# Patient Record
Sex: Male | Born: 1980 | Hispanic: No | Marital: Married | Smoking: Current every day smoker
Health system: Southern US, Community
[De-identification: ages and names within clinical notes are randomized; demographics above are authoritative.]

## PROBLEM LIST (undated history)

## (undated) DIAGNOSIS — Z789 Other specified health status: Secondary | ICD-10-CM

## (undated) HISTORY — PX: APPENDECTOMY: SHX54

---
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
## 2015-03-13 ENCOUNTER — Encounter (HOSPITAL_COMMUNITY): Payer: Self-pay | Admitting: *Deleted

## 2015-03-13 ENCOUNTER — Emergency Department (HOSPITAL_COMMUNITY)
Admission: EM | Admit: 2015-03-13 | Discharge: 2015-03-13 | Disposition: A | Discharge status: Home / Self Care | Payer: Self-pay | Attending: Emergency Medicine | Admitting: Emergency Medicine

## 2015-03-13 ENCOUNTER — Emergency Department (HOSPITAL_COMMUNITY): Payer: Self-pay

## 2015-03-13 DIAGNOSIS — F17210 Nicotine dependence, cigarettes, uncomplicated: Secondary | ICD-10-CM | POA: Insufficient documentation

## 2015-03-13 DIAGNOSIS — M5417 Radiculopathy, lumbosacral region: Secondary | ICD-10-CM | POA: Insufficient documentation

## 2015-03-13 DIAGNOSIS — R1030 Lower abdominal pain, unspecified: Secondary | ICD-10-CM | POA: Insufficient documentation

## 2015-03-13 LAB — URINALYSIS, ROUTINE W REFLEX MICROSCOPIC
Bilirubin Urine: NEGATIVE
Glucose, UA: NEGATIVE mg/dL
Hgb urine dipstick: NEGATIVE
KETONES UR: NEGATIVE mg/dL
LEUKOCYTES UA: NEGATIVE
Nitrite: NEGATIVE
PROTEIN: NEGATIVE mg/dL
Specific Gravity, Urine: 1.009 (ref 1.005–1.030)
pH: 6 (ref 5.0–8.0)

## 2015-03-13 MED ORDER — NAPROXEN 500 MG PO TABS: 500.0000 mg | ORAL_TABLET | Freq: Two times a day (BID) | ORAL | Status: DC

## 2015-03-13 MED ORDER — PREDNISONE 20 MG PO TABS: 40.0000 mg | ORAL_TABLET | Freq: Every day | ORAL | Status: DC

## 2015-03-13 MED ORDER — OXYCODONE-ACETAMINOPHEN 5-325 MG PO TABS
1.0000 | ORAL_TABLET | Freq: Once | ORAL | Status: AC
  Administered 2015-03-13: 1 via ORAL
  Filled 2015-03-13: qty 1

## 2015-03-13 MED ORDER — HYDROCODONE-ACETAMINOPHEN 5-325 MG PO TABS: 1.0000 | ORAL_TABLET | Freq: Four times a day (QID) | ORAL | Status: DC | PRN

## 2015-03-13 MED ORDER — METHOCARBAMOL 500 MG PO TABS: 500.0000 mg | ORAL_TABLET | Freq: Two times a day (BID) | ORAL | Status: DC

## 2015-03-13 MED ORDER — KETOROLAC TROMETHAMINE 60 MG/2ML IM SOLN
60.0000 mg | Freq: Once | INTRAMUSCULAR | Status: AC
  Administered 2015-03-13: 60 mg via INTRAMUSCULAR
  Filled 2015-03-13: qty 2

## 2015-03-13 NOTE — ED Provider Notes (Signed)
CSN: [id number]     Arrival date & time 03/13/15  1250 History  By signing my name below, I, Iona BeardChristian Pulliam, attest that this documentation has been prepared under the direction and in the presence of Joeline Freer, PA-C.  Electronically Signed: Iona Beardhristian Pulliam, ED Scribe 03/13/2015 at 3:50 PM.   Chief Complaint  Patient presents with  . Flank Pain    The history is provided by the patient. No language interpreter was used.   HPI Comments: Alba DestineJulio S Hodsdon is a 35 y.o. male who presents to the Emergency Department complaining of gradual onset, constant back pain, onset about two weeks ago. Pt states that he lifted a chair at work and felt his back tightening up about 15 minutes later. He believed the pain would go away but states it has remained the same. Pt reports associated throbbing pain in his right groin area and says the back pain is shooting on his lower, right side. The back pain is worsened with movement and prolonged sitting. Naproxentaken and tramadol taken PTA with minimal relief to symptoms. No other worsening or alleviating factors noted. Pt denies difficulty urinating, bowel incontinence, urinary incontinence, numbness, weakness, or any other pertinent symptoms.   History reviewed. No pertinent past medical history. History reviewed. No pertinent past surgical history. History reviewed. No pertinent family history. Social History  Substance Use Topics  . Smoking status: Current Every Day Smoker    Types: Cigarettes  . Smokeless tobacco: Never Used  . Alcohol Use: Yes     Comment: occ.    Review of Systems  Constitutional: Negative for fever.  Genitourinary: Negative for difficulty urinating.  Musculoskeletal: Positive for back pain.  Neurological: Negative for weakness and numbness.    Allergies  Review of patient's allergies indicates not on file.  Home Medications   Prior to Admission medications   Not on File   BP 133/88 mmHg  Pulse 108  Temp(Src)  97.6 F (36.4 C) (Oral)  Resp 16  Ht 5' 8.5" (1.74 m)  Wt 160 lb 5 oz (72.717 kg)  BMI 24.02 kg/m2  SpO2 100% Physical Exam  Constitutional: He is oriented to person, place, and time. He appears well-developed and well-nourished.  HENT:  Head: Normocephalic.  Eyes: EOM are normal.  Neck: Normal range of motion.  Pulmonary/Chest: Effort normal.  Abdominal: He exhibits no distension.  Genitourinary:  Normal bilateral scrotum with no swelling or tenderness. No lesions to the perineum. No penile discharge. Cremasteric reflex present  Musculoskeletal: Normal range of motion.  Tenderness to palpation over right paraspinal muscles, mild tenderness to the midline lumbar spine. No tenderness over right SI joint her right buttock. Pain with right straight leg raise.  Neurological: He is alert and oriented to person, place, and time.  5/5 and equal lower extremity strength. 2+ and equal patellar reflexes bilaterally. Pt able to dorsiflex bilateral toes and feet with good strength against resistance. Equal sensation bilaterally over thighs and lower legs.   Psychiatric: He has a normal mood and affect.  Nursing note and vitals reviewed.   ED Course  Procedures (including critical care time) DIAGNOSTIC STUDIES: Oxygen Saturation is 100% on RA, normal by my interpretation.    COORDINATION OF CARE: 1:36 PM-Discussed treatment plan which includes urinalysis and DG lumbar spine complete  with pt at bedside and pt agreed to plan.   Labs Review Labs Reviewed  URINALYSIS, ROUTINE W REFLEX MICROSCOPIC (NOT AT [facility])    Imaging Review Dg Lumbar Spine Complete  03/13/2015  CLINICAL DATA:  Right-sided lumbar pain for 2 weeks. No known injury. EXAM: LUMBAR SPINE - COMPLETE 4+ VIEW COMPARISON:  None. FINDINGS: Transitional anatomy is identified with 6 lumbar type vertebral bodies. An assimilation joint is seen on the right. No inter pedicular widening is identified. No pars defects. Minimal anterior  wedging of L1 is identified with less than 10% loss of anterior height. No other abnormalities. IMPRESSION: Minimal age-indeterminate anterior wedging of L1 of doubtful acute significance. Recommend clinical correlation. No other abnormalities. Electronically Signed   By: Gerome Sam III M.D   On: 03/13/2015 15:42   I have personally reviewed and evaluated these images and lab results as part of my medical decision-making.   EKG Interpretation None      MDM   Final diagnoses:  None   Patient emergency department complaining of lower back pain radiating to the right buttock and right groin. Neurological exam is unremarkable. Patient appears to be in severe pain. I do not think patient has a kidney stone although on differential. Pt's scrotal exam is nrmal.  His pain is worsened with any movement. Question radicular pain. Will get x-rays of the lumbar spine. Toradol and Percocet given for pain.  4:03 PM X-ray result is showing minimal age-indeterminate anterior wedging of L1. Discussed with Dr. Clayborne Dana, will get lumbar CT for further evaluation. Patient feels slightly improved with medications.  Pt signed out at shift change pending CT. If negative or no emergent findings, follow up with pcp or specialist. Home on prednisone for radicular pain, pain medications, muscle relaxant.    Jaynie Crumble, PA-C 03/14/15 1657  Marily Memos, MD 03/17/15 [phone]

## 2015-03-13 NOTE — ED Notes (Signed)
Pt reports RT flank pain started on 03-03-15 . Pt reports pain radiates into RT groin.

## 2015-03-13 NOTE — Discharge Instructions (Signed)
Naprosyn for pain and inflammation. Robaxin for spasms. Norco for severe pain pain. Prednisone until all gone. Follow up with primary care doctor.     Lumbosacral Radiculopathy Lumbosacral radiculopathy is a condition that involves the spinal nerves and nerve roots in the low back and bottom of the spine. The condition develops when these nerves and nerve roots move out of place or become inflamed and cause symptoms. CAUSES This condition may be caused by: 1. Pressure from a disk that bulges out of place (herniated disk). A disk is a plate of cartilage that separates bones in the spine. 2. Disk degeneration. 3. A narrowing of the bones of the lower back (spinal stenosis). 4. A tumor. 5. An infection. 6. An injury that places sudden pressure on the disks that cushion the bones of your lower spine. RISK FACTORS This condition is more likely to develop in: 1. Males aged 30-50 years. 2. Females aged 50-60 years. 3. People who lift improperly. 4. People who are overweight or live a sedentary lifestyle. 5. People who smoke. 6. People who perform repetitive activities that strain the spine. SYMPTOMS Symptoms of this condition include: 1. Pain that goes down from the back into the legs (sciatica). This is the most common symptom. The pain may be worse with sitting, coughing, or sneezing. 2. Pain and numbness in the arms and legs. 3. Muscle weakness. 4. Tingling. 5. Loss of bladder control or bowel control. DIAGNOSIS This condition is diagnosed with a physical exam and medical history. If the pain is lasting, you may have tests, such as: 1. MRI scan. 2. X-ray. 3. CT scan. 4. Myelogram. 5. Nerve conduction study. TREATMENT This condition is often treated with: 1. Hot packs and ice applied to affected areas. 2. Stretches to improve flexibility. 3. Exercises to strengthen back muscles. 4. Physical therapy. 5. Pain medicine. 6. A steroid injection in the spine. In some cases, no  treatment is needed. If the condition is long-lasting (chronic), or if symptoms are severe, treatment may involve surgery or lifestyle changes, such as following a weight loss plan. HOME CARE INSTRUCTIONS Medicines 1. Take medicines only as directed by your health care provider. 2. Do not drive or operate heavy machinery while taking pain medicine. Injury Care 1. Apply a heat pack to the injured area as directed by your health care provider. 2. Apply ice to the affected area: 1. Put ice in a plastic bag. 2. Place a towel between your skin and the bag. 3. Leave the ice on for 20-30 minutes, every 2 hours while you are awake or as needed. Or, leave the ice on for as long as directed by your health care provider. Other Instructions  If you were shown how to do any exercises or stretches, do them as directed by your health care provider.  If your health care provider prescribed a diet or exercise program, follow it as directed.  Keep all follow-up visits as directed by your health care provider. This is important. SEEK MEDICAL CARE IF:  Your pain does not improve over time even when taking pain medicines. SEEK IMMEDIATE MEDICAL CARE IF:  Your develop severe pain.  Your pain suddenly gets worse.  You develop increasing weakness in your legs.  You lose the ability to control your bladder or bowel.  You have difficulty walking or balancing.  You have a fever.   This information is not intended to replace advice given to you by your health care provider. Make sure you discuss any questions  you have with your health care provider.   Document Released: 12/19/2004 Document Revised: 05/05/2014 Document Reviewed: 12/15/2013 Elsevier Interactive Patient Education 2016 Elsevier Inc.  Back Exercises The following exercises strengthen the muscles that help to support the back. They also help to keep the lower back flexible. Doing these exercises can help to prevent back pain or lessen  existing pain. If you have back pain or discomfort, try doing these exercises 2-3 times each day or as told by your health care provider. When the pain goes away, do them once each day, but increase the number of times that you repeat the steps for each exercise (do more repetitions). If you do not have back pain or discomfort, do these exercises once each day or as told by your health care provider. EXERCISES Single Knee to Chest Repeat these steps 3-5 times for each leg: 7. Lie on your back on a firm bed or the floor with your legs extended. 8. Bring one knee to your chest. Your other leg should stay extended and in contact with the floor. 9. Hold your knee in place by grabbing your knee or thigh. 10. Pull on your knee until you feel a gentle stretch in your lower back. 11. Hold the stretch for 10-30 seconds. 12. Slowly release and straighten your leg. Pelvic Tilt Repeat these steps 5-10 times: 7. Lie on your back on a firm bed or the floor with your legs extended. 8. Bend your knees so they are pointing toward the ceiling and your feet are flat on the floor. 9. Tighten your lower abdominal muscles to press your lower back against the floor. This motion will tilt your pelvis so your tailbone points up toward the ceiling instead of pointing to your feet or the floor. 10. With gentle tension and even breathing, hold this position for 5-10 seconds. Cat-Cow Repeat these steps until your lower back becomes more flexible: 6. Get into a hands-and-knees position on a firm surface. Keep your hands under your shoulders, and keep your knees under your hips. You may place padding under your knees for comfort. 7. Let your head hang down, and point your tailbone toward the floor so your lower back becomes rounded like the back of a cat. 8. Hold this position for 5 seconds. 9. Slowly lift your head and point your tailbone up toward the ceiling so your back forms a sagging arch like the back of a  cow. 10. Hold this position for 5 seconds. Press-Ups Repeat these steps 5-10 times: 6. Lie on your abdomen (face-down) on the floor. 7. Place your palms near your head, about shoulder-width apart. 8. While you keep your back as relaxed as possible and keep your hips on the floor, slowly straighten your arms to raise the top half of your body and lift your shoulders. Do not use your back muscles to raise your upper torso. You may adjust the placement of your hands to make yourself more comfortable. 9. Hold this position for 5 seconds while you keep your back relaxed. 10. Slowly return to lying flat on the floor. Bridges Repeat these steps 10 times: 7. Lie on your back on a firm surface. 8. Bend your knees so they are pointing toward the ceiling and your feet are flat on the floor. 9. Tighten your buttocks muscles and lift your buttocks off of the floor until your waist is at almost the same height as your knees. You should feel the muscles working in your buttocks and the  back of your thighs. If you do not feel these muscles, slide your feet 1-2 inches farther away from your buttocks. 10. Hold this position for 3-5 seconds. 11. Slowly lower your hips to the starting position, and allow your buttocks muscles to relax completely. If this exercise is too easy, try doing it with your arms crossed over your chest. Abdominal Crunches Repeat these steps 5-10 times: 3. Lie on your back on a firm bed or the floor with your legs extended. 4. Bend your knees so they are pointing toward the ceiling and your feet are flat on the floor. 5. Cross your arms over your chest. 6. Tip your chin slightly toward your chest without bending your neck. 7. Tighten your abdominal muscles and slowly raise your trunk (torso) high enough to lift your shoulder blades a tiny bit off of the floor. Avoid raising your torso higher than that, because it can put too much stress on your low back and it does not help to strengthen  your abdominal muscles. 8. Slowly return to your starting position. Back Lifts Repeat these steps 5-10 times: 3. Lie on your abdomen (face-down) with your arms at your sides, and rest your forehead on the floor. 4. Tighten the muscles in your legs and your buttocks. 5. Slowly lift your chest off of the floor while you keep your hips pressed to the floor. Keep the back of your head in line with the curve in your back. Your eyes should be looking at the floor. 6. Hold this position for 3-5 seconds. 7. Slowly return to your starting position. SEEK MEDICAL CARE IF:  Your back pain or discomfort gets much worse when you do an exercise.  Your back pain or discomfort does not lessen within 2 hours after you exercise. If you have any of these problems, stop doing these exercises right away. Do not do them again unless your health care provider says that you can. SEEK IMMEDIATE MEDICAL CARE IF:  You develop sudden, severe back pain. If this happens, stop doing the exercises right away. Do not do them again unless your health care provider says that you can.   This information is not intended to replace advice given to you by your health care provider. Make sure you discuss any questions you have with your health care provider.   Document Released: 01/27/2004 Document Revised: 09/09/2014 Document Reviewed: 02/12/2014 Elsevier Interactive Patient Education 2016 [employer]. [employer] Assistance The [employer] Ways 211 is a great source of information about community services available.  Access by dialing 2-1-1 from anywhere in [place], or by website -  PooledIncome.pl.   Other Local Resources (Updated 01/2015)  Financial Assistance   Services    Phone Number and Address  [facility]  Low-cost medical care - 1st and 3rd Saturday of every month  Must not qualify for public or private insurance and must have limited income [phone] [address] [place], [place]    [place] [employer] of Social Services  Child care  Emergency assistance for housing and [employer]  Medicaid [phone] [address] [place], [place] [address]   [facility]  Low-cost medical care for children, communicable diseases, sexually-transmitted diseases, immunizations, maternity care, womens health and family planning [phone] [address] [place], [place] [address]  [facility]   Medication assistance for [facility] residents  Must meet income requirements [phone] [address] [place],  .    [facility]  Child care  Emergency assistance for housing and [employer]  Medicaid [phone] [address] [place], [place] [address]  [facility]   Low-cost medical care,   Monday through Friday, 9 am to 6 pm.   Accepts Medicare/Medicaid, and self-pay [phone] [address] [place], [place] [address]  [facility]  Low-cost medical care - Monday through Friday, 8:30 am - 5:30 pm  Accepts Medicaid and self-pay [phone] [address] [place], [place] [address]   [facility]  Primary medical care, including for those with sickle cell disease  Accepts Medicare, Medicaid, insurance and self-pay [phone] [address] [place], [place]  [facility]   Primary medical care  Accepts Medicare, [place], insurance and self-pay [phone] [address] [place], [place] [address]   [facility] Department of Social Services  Child care  Emergency assistance for housing and [employer]  Medicaid [phone] [address] [place], [place] [address]  [facility] Department of Health and [employer]  Child  care  Emergency assistance for housing and [employer]  Medicaid [phone] [address] [place], [place] [address]   [facility] Medication Assistance Program  Medication assistance for [facility] residents with no insurance only  Must have a primary care doctor [phone] [address] [place], [place]  [facility]   Primary medical care  [place], [place], insurance  [phone] [address] [place], [place]  [facility]   Medication assistance [phone]  [facility]   Primary medical care  Accepts Medicare, [place], insurance and self-pay [phone] [address] [place], [place] [address]  [facility]   Primary medical care  Accepts Medicare, [place], insurance and self-pay [phone] [address] [place], [place] [address]  [facility]  For [place] residents between the ages of 49 and 71 who do not have any form of health insurance, Medicare, [place], or [place] benefits.  Services are provided free of charge to uninsured patients who fall within federal poverty guidelines.    Hours: Tuesdays and Thursdays, 4:15 - 8 pm [phone] [address] [place], [place] [address]  [facility]     Primary medical care  Dental care  Nutritional counseling  Pharmacy  Accepts Medicaid, Medicare, most insurance.  Fees are adjusted based on ability to pay.   [phone] [facility] [address] [place], [place]  [phone] [facility] [address] [place], [place]  [phone] [facility] [place], [place]  [phone] [facility], [address] [place], [place]  [phone] [facility] [address] [place], [place]  [facility]  Womens health and family planning  [phone] [address] [place], [place]  [facility] Department of Social Services  Child care  Emergency assistance for housing and [employer]  Medicaid [phone] [address], [place], [place] [address]   [facility]    Ages 58 and older  Hours: Mondays and Thursdays, 7:00 am - 9:00 am Patients are seen on a first come, first served basis. [phone], ext. [phone] [place], [place]  [place] Division of Social Services  Child care  Emergency assistance for housing and utilities  Food stamps  Medicaid [phone] [address] [place], [place] [id number]  [facility] [employer]  Medication assistance  Rental assistance  Food pantry  Medication assistance  Housing assistance  Emergency food distribution  Utility assistance [phone] [address] [place], [place]  [phone]  [address] [place], [place] [phone] Hours: Tuesdays and Thursdays from 9am - 12 noon by appointment only  [phone] [address] [place], [place] [id number]  [facility] - Lanae [facility]   Accepts private insurance, [place], and [place].  Payment is based on a sliding scale for those without insurance.  Hours: Mondays, Tuesdays and Thursdays, 8:30 am - 5:30 pm.   [phone] [address] [place], [place]  [facility] - [facility] at [facility]    Accepts private insurance, [place], and [place].  Payment is based on a sliding scale for those without insurance. [phone] [address] [place], [place]  [facility] - Pediatrics at [address] Scientist, research (physical sciences)[place]  Accepts private insurance, [employer]icare, and [place].  Payment is based on a sliding scale for those without insurance [phone] [address], [place], [place]  [facility] - [facility] at [facility]  Accepts private insurance, [place], and  [place].  Payment is based on a sliding scale for those without insurance. [phone] [address] [place], [place]  [facility] - [facility] at [facility]  Accepts private insurance, [place], and [place].  Payment is based on a sliding scale for those without insurance. [phone], [phone] [place], [place].    [facility]  Maternity care.  Accepts Medicaid and self-pay. [phone] [address] [place], [place]

## 2015-03-13 NOTE — ED Notes (Signed)
Declined W/C at D/C and was escorted to lobby by RN. 

## 2015-03-13 NOTE — ED Provider Notes (Signed)
[facility]C shift change pending CT results. For full HPI, please see initial provider's note.  In short, patient presenting for right flank pain 10 days after lifting something heavy at work. Radiation into the groin and right hip area. Movement exacerbates the pain. No urinary symptoms or neurological deficits. Urinalysis negative. Lumbar spine x-ray shows minimal age indeterminate anterior wedging of L1. Provider ordered CT of the lumbar spine to fully evaluate this abnormality.  4:45 - CT lumbar spine with no fracture or malalignment. Patient was discharged home with prednisone, pain medicine and muscle relaxer per initial provider's plan. Patient is to follow-up with a PCP or orthopedic specialist if his symptoms do not improve.  Filed Vitals:   03/13/15 1303  BP: 133/88  Pulse: 108  Temp: 97.6 F (36.4 C)  TempSrc: Oral  Resp: 16  Height: 5' 8.5" (1.74 m)  Weight: 72.717 kg  SpO2: 100%   Results for orders placed or performed during the hospital encounter of 03/13/15 (from the past 48 hour(s))  Urinalysis, Routine w reflex microscopic ([facility])     Status: None   Collection Time: 03/13/15  1:04 PM  Result Value Ref Range   Color, Urine YELLOW YELLOW   APPearance CLEAR CLEAR   Specific Gravity, Urine 1.009 1.005 - 1.030   pH 6.0 5.0 - 8.0   Glucose, UA NEGATIVE NEGATIVE mg/dL   Hgb urine dipstick NEGATIVE NEGATIVE   Bilirubin Urine NEGATIVE NEGATIVE   Ketones, ur NEGATIVE NEGATIVE mg/dL   Protein, ur NEGATIVE NEGATIVE mg/dL   Nitrite NEGATIVE NEGATIVE   Leukocytes, UA NEGATIVE NEGATIVE    Comment: MICROSCOPIC NOT DONE ON URINES WITH NEGATIVE PROTEIN, BLOOD, LEUKOCYTES, NITRITE, OR GLUCOSE <1000 mg/dL.   CT Lumbar Spine Wo Contrast (Final result) Result time: 03/13/15 16:46:16   Final result by Rad Results In Interface (03/13/15 16:46:16)   Narrative:   CLINICAL DATA: Lifting object at work, fell backwards. Back pain for 2  weeks now. Questioning wedge deformity at L1 on plain film.  EXAM: CT LUMBAR SPINE WITHOUT CONTRAST  TECHNIQUE: Multidetector CT imaging of the lumbar spine was performed without intravenous contrast administration. Multiplanar CT image reconstructions were also generated.  COMPARISON: None.  FINDINGS: Normal alignment. Disc spaces are maintained. No visible fracture. L1 appears normal by CT.  IMPRESSION: No fracture or malalignment.   Electronically Signed By: Charlett NoseKevin Dover M.D. On: 03/13/2015 16:46          DG Lumbar Spine Complete (Final result) Result time: 03/13/15 15:42:11   Final result by Rad Results In Interface (03/13/15 15:42:11)   Narrative:   CLINICAL DATA: Right-sided lumbar pain for 2 weeks. No known injury.  EXAM: LUMBAR SPINE - COMPLETE 4+ VIEW  COMPARISON: None.  FINDINGS: Transitional anatomy is identified with 6 lumbar type vertebral bodies. An assimilation joint is seen on the right. No inter pedicular widening is identified. No pars defects. Minimal anterior wedging of L1 is identified with less than 10% loss of anterior height. No other abnormalities.  IMPRESSION: Minimal age-indeterminate anterior wedging of L1 of doubtful acute significance. Recommend clinical correlation. No other abnormalities.   Electronically Signed By: Gerome Samavid Williams III M.D On: 03/13/2015 15:42     Alveta HeimlichStevi Chauntay Paszkiewicz, PA-C 03/13/15 1656

## 2015-07-23 ENCOUNTER — Encounter (HOSPITAL_COMMUNITY): Payer: Self-pay | Admitting: *Deleted

## 2015-07-23 ENCOUNTER — Emergency Department (HOSPITAL_COMMUNITY)
Admission: EM | Admit: 2015-07-23 | Discharge: 2015-07-23 | Disposition: A | Discharge status: Home / Self Care | Payer: Self-pay | Attending: Emergency Medicine | Admitting: Emergency Medicine

## 2015-07-23 ENCOUNTER — Emergency Department (HOSPITAL_COMMUNITY): Payer: Self-pay

## 2015-07-23 DIAGNOSIS — X509XXA Other and unspecified overexertion or strenuous movements or postures, initial encounter: Secondary | ICD-10-CM | POA: Insufficient documentation

## 2015-07-23 DIAGNOSIS — S20211A Contusion of right front wall of thorax, initial encounter: Secondary | ICD-10-CM | POA: Insufficient documentation

## 2015-07-23 DIAGNOSIS — Y929 Unspecified place or not applicable: Secondary | ICD-10-CM | POA: Insufficient documentation

## 2015-07-23 DIAGNOSIS — Y9389 Activity, other specified: Secondary | ICD-10-CM | POA: Insufficient documentation

## 2015-07-23 DIAGNOSIS — Y999 Unspecified external cause status: Secondary | ICD-10-CM | POA: Insufficient documentation

## 2015-07-23 DIAGNOSIS — F17210 Nicotine dependence, cigarettes, uncomplicated: Secondary | ICD-10-CM | POA: Insufficient documentation

## 2015-07-23 MED ORDER — NAPROXEN 500 MG PO TABS: 500.0000 mg | ORAL_TABLET | Freq: Two times a day (BID) | ORAL | Status: DC

## 2015-07-23 MED ORDER — NAPROXEN 250 MG PO TABS
500.0000 mg | ORAL_TABLET | Freq: Once | ORAL | Status: AC
  Administered 2015-07-23: 500 mg via ORAL
  Filled 2015-07-23: qty 2

## 2015-07-23 NOTE — ED Notes (Signed)
Patient able to ambulate independently  

## 2015-07-23 NOTE — Discharge Instructions (Signed)
Your x-ray was normal. Please take naproxen as needed for pain. Return to the ER for new or worsening symptoms.
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
## 2015-07-23 NOTE — ED Notes (Signed)
Pt transported to xray 

## 2015-07-23 NOTE — ED Provider Notes (Signed)
CSN: [id number]     Arrival date & time 07/23/15  1447 History  By signing my name below, I, [name], attest that this documentation has been prepared under the direction and in the presence of Noelle Penner, PA-C Electronically Signed: Charline Bills, ED Scribe 07/23/2015 at 3:29 PM.   Chief Complaint  Patient presents with  . Rib Injury   The history is provided by the patient. No language interpreter was used.   HPI Comments: Colton Peters is a 35 y.o. male who presents to the Emergency Department complaining of a right rib injury sustained 2 days ago. Pt states that he was horse playing with a friend who is considerably larger than he is 2 days ago when he felt a sudden pop in his right ribs. He states that he has had constant sharp pain to the area since. Pt reports increased pain with deep breaths and leaning to the right. He has tried Goody's powder and Naproxen with minimal relief.   History reviewed. No pertinent past medical history. History reviewed. No pertinent past surgical history. History reviewed. No pertinent family history. Social History  Substance Use Topics  . Smoking status: Current Every Day Smoker    Types: Cigarettes  . Smokeless tobacco: Never Used  . Alcohol Use: Yes     Comment: occ.    Review of Systems  Musculoskeletal:       + R rib pain  All other systems reviewed and are negative.  Allergies  Review of patient's allergies indicates no known allergies.  Home Medications   Prior to Admission medications   Medication Sig Start Date End Date Taking? Authorizing Provider  HYDROcodone-acetaminophen (NORCO) 5-325 MG tablet Take 1 tablet by mouth every 6 (six) hours as needed for moderate pain. 03/13/15   Tatyana Kirichenko, PA-C  methocarbamol (ROBAXIN) 500 MG tablet Take 1 tablet (500 mg total) by mouth 2 (two) times daily. 03/13/15   Tatyana Kirichenko, PA-C  naproxen (NAPROSYN) 500 MG tablet Take 1 tablet (500 mg total) by mouth 2 (two) times  daily. 03/13/15   Tatyana Kirichenko, PA-C  predniSONE (DELTASONE) 20 MG tablet Take 2 tablets (40 mg total) by mouth daily. 03/13/15   Tatyana Kirichenko, PA-C   BP 122/82 mmHg  Pulse 100  Temp(Src) 98.5 F (36.9 C) (Oral)  Resp 18  SpO2 100% Physical Exam  Constitutional: He is oriented to person, place, and time. He appears well-developed and well-nourished. No distress.  HENT:  Head: Normocephalic and atraumatic.  Eyes: Conjunctivae and EOM are normal.  Neck: Neck supple. No tracheal deviation present.  Cardiovascular: Normal rate.   Pulmonary/Chest: Effort normal and breath sounds normal. No respiratory distress. He has no wheezes. He has no rales.    Point tenderness to the R lower ribs as depicted  No flail chest No crepitus Lung sounds clear throughout Equal expansion bilaterally  Musculoskeletal: Normal range of motion.  Neurological: He is alert and oriented to person, place, and time.  Skin: Skin is warm and dry.  Psychiatric: He has a normal mood and affect. His behavior is normal.  Nursing note and vitals reviewed.  ED Course  Procedures (including critical care time) DIAGNOSTIC STUDIES: Oxygen Saturation is 100% on RA, normal by my interpretation.    COORDINATION OF CARE: 3:13 PM-Discussed treatment plan which includes XR with pt at bedside and pt agreed to plan.   Labs Review Labs Reviewed - No data to display  Imaging Review No results found. I have personally reviewed  and evaluated these images and lab results as part of my medical decision-making.   EKG Interpretation None      MDM   Final diagnoses:  None   Pt presenting with likely chest contusion. willl obtain CXR to r/o rib fracture. Anticipate d/c home with supportive meds. At end of shift x-ray is still pending will sign out to oncoming team.   I personally performed the services described in this documentation, which was scribed in my presence. The recorded information has been reviewed  and is accurate.   Carlene CoriaSerena Y Lynette Topete, PA-C 07/23/15 1806   Loren Raceravid Yelverton, MD 08/07/15 [phone]
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
## 2021-04-02 ENCOUNTER — Other Ambulatory Visit: Payer: Self-pay

## 2021-04-02 ENCOUNTER — Ambulatory Visit
Admission: EM | Admit: 2021-04-02 | Discharge: 2021-04-02 | Disposition: A | Discharge status: Home / Self Care | Payer: Self-pay | Attending: Physician Assistant | Admitting: Physician Assistant

## 2021-04-02 ENCOUNTER — Encounter: Payer: Self-pay | Admitting: Physician Assistant

## 2021-04-02 DIAGNOSIS — Z113 Encounter for screening for infections with a predominantly sexual mode of transmission: Secondary | ICD-10-CM

## 2021-04-02 NOTE — ED Triage Notes (Signed)
5 day h/o penile discharge and itching. Denies urinary sxs. Pt is requesting STD testing. ?
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
## 2021-04-04 LAB — CYTOLOGY, (ORAL, ANAL, URETHRAL) ANCILLARY ONLY
Chlamydia: NEGATIVE
Comment: NEGATIVE
Comment: NEGATIVE
Comment: NORMAL
Neisseria Gonorrhea: NEGATIVE
Trichomonas: NEGATIVE

## 2021-04-05 ENCOUNTER — Telehealth: Payer: Self-pay | Admitting: Nurse Practitioner

## 2021-04-05 DIAGNOSIS — R369 Urethral discharge, unspecified: Secondary | ICD-10-CM

## 2021-04-05 MED ORDER — DOXYCYCLINE HYCLATE 100 MG PO TABS: 100.0000 mg | ORAL_TABLET | Freq: Two times a day (BID) | ORAL | 0 refills | Status: AC
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
## 2021-12-29 ENCOUNTER — Emergency Department (HOSPITAL_COMMUNITY): Payer: 59

## 2021-12-29 ENCOUNTER — Encounter (HOSPITAL_COMMUNITY)
Admission: EM | Disposition: A | Discharge status: Home / Self Care | Payer: Self-pay | Source: Home / Self Care | Attending: Emergency Medicine

## 2021-12-29 ENCOUNTER — Ambulatory Visit (HOSPITAL_COMMUNITY)
Admission: EM | Admit: 2021-12-29 | Discharge: 2021-12-30 | Disposition: A | Discharge status: Home / Self Care | Payer: 59 | Attending: Emergency Medicine | Admitting: Emergency Medicine

## 2021-12-29 DIAGNOSIS — S6431XA Injury of digital nerve of right thumb, initial encounter: Secondary | ICD-10-CM | POA: Diagnosis not present

## 2021-12-29 DIAGNOSIS — S62511B Displaced fracture of proximal phalanx of right thumb, initial encounter for open fracture: Secondary | ICD-10-CM | POA: Insufficient documentation

## 2021-12-29 DIAGNOSIS — Y9301 Activity, walking, marching and hiking: Secondary | ICD-10-CM | POA: Diagnosis not present

## 2021-12-29 DIAGNOSIS — W3400XA Accidental discharge from unspecified firearms or gun, initial encounter: Secondary | ICD-10-CM | POA: Diagnosis not present

## 2021-12-29 DIAGNOSIS — Z23 Encounter for immunization: Secondary | ICD-10-CM | POA: Diagnosis not present

## 2021-12-29 DIAGNOSIS — S62511A Displaced fracture of proximal phalanx of right thumb, initial encounter for closed fracture: Secondary | ICD-10-CM | POA: Diagnosis not present

## 2021-12-29 DIAGNOSIS — S62521A Displaced fracture of distal phalanx of right thumb, initial encounter for closed fracture: Secondary | ICD-10-CM | POA: Diagnosis not present

## 2021-12-29 DIAGNOSIS — W3400XD Accidental discharge from unspecified firearms or gun, subsequent encounter: Secondary | ICD-10-CM

## 2021-12-29 DIAGNOSIS — Y92410 Unspecified street and highway as the place of occurrence of the external cause: Secondary | ICD-10-CM | POA: Diagnosis not present

## 2021-12-29 DIAGNOSIS — S61031A Puncture wound without foreign body of right thumb without damage to nail, initial encounter: Secondary | ICD-10-CM | POA: Diagnosis present

## 2021-12-29 DIAGNOSIS — S65111A Laceration of radial artery at wrist and hand level of right arm, initial encounter: Secondary | ICD-10-CM | POA: Diagnosis not present

## 2021-12-29 HISTORY — DX: Accidental discharge from unspecified firearms or gun, subsequent encounter: W34.00XD

## 2021-12-29 HISTORY — PX: INCISION AND DRAINAGE OF WOUND: SHX1803

## 2021-12-29 HISTORY — PX: TENDON REPAIR: SHX5111

## 2021-12-29 LAB — COMPREHENSIVE METABOLIC PANEL
ALT: 16 U/L (ref 0–44)
AST: 29 U/L (ref 15–41)
Albumin: 3.7 g/dL (ref 3.5–5.0)
Alkaline Phosphatase: 52 U/L (ref 38–126)
Anion gap: 14 (ref 5–15)
BUN: 15 mg/dL (ref 6–20)
CO2: 20 mmol/L — ABNORMAL LOW (ref 22–32)
Calcium: 8.8 mg/dL — ABNORMAL LOW (ref 8.9–10.3)
Chloride: 105 mmol/L (ref 98–111)
Creatinine, Ser: 1.24 mg/dL (ref 0.61–1.24)
GFR, Estimated: >60 mL/min (ref >60)
Glucose, Bld: 136 mg/dL — ABNORMAL HIGH (ref 70–99)
Potassium: 3.5 mmol/L (ref 3.5–5.1)
Sodium: 139 mmol/L (ref 135–145)
Total Bilirubin: 0.7 mg/dL (ref 0.3–1.2)
Total Protein: 6.8 g/dL (ref 6.5–8.1)

## 2021-12-29 LAB — PROTIME-INR
INR: 0.9 (ref 0.8–1.2)
Prothrombin Time: 12.1 seconds (ref 11.4–15.2)

## 2021-12-29 LAB — CBC
HCT: 45.3 % (ref 39.0–52.0)
Hemoglobin: 14 g/dL (ref 13.0–17.0)
MCH: 25.9 pg — ABNORMAL LOW (ref 26.0–34.0)
MCHC: 30.9 g/dL (ref 30.0–36.0)
MCV: 83.9 fL (ref 80.0–100.0)
Platelets: 275 10*3/uL (ref 150–400)
RBC: 5.4 MIL/uL (ref 4.22–5.81)
RDW: 16.1 % — ABNORMAL HIGH (ref 11.5–15.5)
WBC: 9.6 10*3/uL (ref 4.0–10.5)
nRBC: 0 % (ref 0.0–0.2)

## 2021-12-29 LAB — APTT: aPTT: 24 seconds (ref 24–36)

## 2021-12-29 SURGERY — IRRIGATION AND DEBRIDEMENT WOUND
Anesthesia: General | Laterality: Right

## 2021-12-29 MED ORDER — CEFAZOLIN SODIUM-DEXTROSE 1-4 GM/50ML-% IV SOLN
1.0000 g | Freq: Once | INTRAVENOUS | Status: AC
  Administered 2021-12-29: 1 g via INTRAVENOUS
  Filled 2021-12-29: qty 50

## 2021-12-29 MED ORDER — FENTANYL CITRATE (PF) 250 MCG/5ML IJ SOLN
INTRAMUSCULAR | Status: AC
  Filled 2021-12-29: qty 5

## 2021-12-29 MED ORDER — TETANUS-DIPHTH-ACELL PERTUSSIS 5-2.5-18.5 LF-MCG/0.5 IM SUSY
0.5000 mL | PREFILLED_SYRINGE | Freq: Once | INTRAMUSCULAR | Status: AC
  Administered 2021-12-29: 0.5 mL via INTRAMUSCULAR
  Filled 2021-12-29: qty 0.5

## 2021-12-29 MED ORDER — MIDAZOLAM HCL 2 MG/2ML IJ SOLN
INTRAMUSCULAR | Status: AC
  Filled 2021-12-29: qty 2

## 2021-12-29 MED ORDER — MORPHINE SULFATE (PF) 4 MG/ML IV SOLN
4.0000 mg | Freq: Once | INTRAVENOUS | Status: AC
  Administered 2021-12-29: 4 mg via INTRAVENOUS
  Filled 2021-12-29: qty 1

## 2021-12-29 SURGICAL SUPPLY — 50 items
BAG COUNTER SPONGE SURGICOUNT (BAG) ×1 IMPLANT
BAND RUBBER #18 3X1/16 STRL (MISCELLANEOUS) ×1 IMPLANT
BLADE MINI RND TIP GREEN BEAV (BLADE) IMPLANT
BNDG COHESIVE 1X5 TAN STRL LF (GAUZE/BANDAGES/DRESSINGS) IMPLANT
BNDG COHESIVE 2X5 TAN STRL LF (GAUZE/BANDAGES/DRESSINGS) IMPLANT
BNDG COHESIVE 3X5 TAN STRL LF (GAUZE/BANDAGES/DRESSINGS) IMPLANT
BNDG ELASTIC 3X5.8 VLCR STR LF (GAUZE/BANDAGES/DRESSINGS) IMPLANT
BNDG ESMARK 4X9 LF (GAUZE/BANDAGES/DRESSINGS) ×1 IMPLANT
BNDG GAUZE DERMACEA FLUFF 4 (GAUZE/BANDAGES/DRESSINGS) IMPLANT
CORD BIPOLAR FORCEPS 12FT (ELECTRODE) ×1 IMPLANT
CUFF TOURN SGL QUICK 18X4 (TOURNIQUET CUFF) ×1 IMPLANT
CUFF TOURN SGL QUICK 24 (TOURNIQUET CUFF)
CUFF TRNQT CYL 24X4X16.5-23 (TOURNIQUET CUFF) IMPLANT
DRAPE OEC MINIVIEW 54X84 (DRAPES) IMPLANT
DRSG KUZMA FLUFF (GAUZE/BANDAGES/DRESSINGS) IMPLANT
DRSG XEROFORM 1X8 (GAUZE/BANDAGES/DRESSINGS) IMPLANT
DURAPREP 26ML APPLICATOR (WOUND CARE) ×1 IMPLANT
GAUZE SPONGE 2X2 8PLY STRL LF (GAUZE/BANDAGES/DRESSINGS) IMPLANT
GAUZE SPONGE 4X4 12PLY STRL (GAUZE/BANDAGES/DRESSINGS) IMPLANT
GAUZE XEROFORM 1X8 LF (GAUZE/BANDAGES/DRESSINGS) ×1 IMPLANT
GLOVE BIO SURGEON STRL SZ7.5 (GLOVE) ×1 IMPLANT
GOWN STRL REUS W/ TWL LRG LVL3 (GOWN DISPOSABLE) ×3 IMPLANT
GOWN STRL REUS W/ TWL XL LVL3 (GOWN DISPOSABLE) ×1 IMPLANT
GOWN STRL REUS W/TWL LRG LVL3 (GOWN DISPOSABLE) ×3
GOWN STRL REUS W/TWL XL LVL3 (GOWN DISPOSABLE) ×1
K-WIRE 0.9X102 (Wire) ×1 IMPLANT
KIT BASIN OR (CUSTOM PROCEDURE TRAY) ×1 IMPLANT
KIT TURNOVER KIT B (KITS) ×1 IMPLANT
KWIRE 0.9X102 (Wire) IMPLANT
MANIFOLD NEPTUNE II (INSTRUMENTS) ×1 IMPLANT
NDL HYPO 25GX1X1/2 BEV (NEEDLE) IMPLANT
NEEDLE HYPO 25GX1X1/2 BEV (NEEDLE) IMPLANT
NS IRRIG 1000ML POUR BTL (IV SOLUTION) ×1 IMPLANT
PACK ORTHO EXTREMITY (CUSTOM PROCEDURE TRAY) ×1 IMPLANT
PAD ARMBOARD 7.5X6 YLW CONV (MISCELLANEOUS) ×2 IMPLANT
PAD CAST 3X4 CTTN HI CHSV (CAST SUPPLIES) IMPLANT
PAD CAST 4YDX4 CTTN HI CHSV (CAST SUPPLIES) IMPLANT
PADDING CAST COTTON 3X4 STRL (CAST SUPPLIES) ×1
PADDING CAST COTTON 4X4 STRL (CAST SUPPLIES) ×1
SPECIMEN JAR SMALL (MISCELLANEOUS) ×1 IMPLANT
SPIKE FLUID TRANSFER (MISCELLANEOUS) IMPLANT
SUT ETHILON 5 0 PS 2 18 (SUTURE) IMPLANT
SUT SILK 4 0 PS 2 (SUTURE) IMPLANT
SUT VICRYL 4-0 PS2 18IN ABS (SUTURE) IMPLANT
SYR CONTROL 10ML LL (SYRINGE) IMPLANT
TOWEL GREEN STERILE (TOWEL DISPOSABLE) ×1 IMPLANT
TOWEL GREEN STERILE FF (TOWEL DISPOSABLE) ×1 IMPLANT
TUBE FEEDING ENTERAL 5FR 16IN (TUBING) IMPLANT
UNDERPAD 30X36 HEAVY ABSORB (UNDERPADS AND DIAPERS) ×1 IMPLANT
WATER STERILE IRR 1000ML POUR (IV SOLUTION) ×1 IMPLANT
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
## 2021-12-30 ENCOUNTER — Emergency Department (EMERGENCY_DEPARTMENT_HOSPITAL): Payer: No Typology Code available for payment source | Admitting: Anesthesiology

## 2021-12-30 ENCOUNTER — Encounter (HOSPITAL_COMMUNITY): Payer: Self-pay | Admitting: Anesthesiology

## 2021-12-30 ENCOUNTER — Emergency Department (HOSPITAL_COMMUNITY): Payer: 59 | Admitting: Anesthesiology

## 2021-12-30 ENCOUNTER — Other Ambulatory Visit: Payer: Self-pay

## 2021-12-30 ENCOUNTER — Emergency Department (HOSPITAL_COMMUNITY): Payer: No Typology Code available for payment source

## 2021-12-30 DIAGNOSIS — S62521A Displaced fracture of distal phalanx of right thumb, initial encounter for closed fracture: Secondary | ICD-10-CM

## 2021-12-30 DIAGNOSIS — S65111A Laceration of radial artery at wrist and hand level of right arm, initial encounter: Secondary | ICD-10-CM

## 2021-12-30 DIAGNOSIS — S6431XA Injury of digital nerve of right thumb, initial encounter: Secondary | ICD-10-CM

## 2021-12-30 DIAGNOSIS — S62511A Displaced fracture of proximal phalanx of right thumb, initial encounter for closed fracture: Secondary | ICD-10-CM

## 2021-12-30 LAB — TYPE AND SCREEN
ABO/RH(D): O POS
Antibody Screen: NEGATIVE

## 2021-12-30 LAB — LACTIC ACID, PLASMA: Lactic Acid, Venous: 2.5 mmol/L (ref 0.5–1.9)

## 2021-12-30 MED ORDER — HYDROCODONE-ACETAMINOPHEN 5-325 MG PO TABS: ORAL_TABLET | ORAL | 0 refills | Status: DC

## 2021-12-30 MED ORDER — MIDAZOLAM HCL 5 MG/5ML IJ SOLN
INTRAMUSCULAR | Status: DC | PRN
  Administered 2021-12-30: 2 mg via INTRAVENOUS

## 2021-12-30 MED ORDER — PROPOFOL 10 MG/ML IV BOLUS
INTRAVENOUS | Status: AC
  Filled 2021-12-30: qty 20

## 2021-12-30 MED ORDER — HYDROMORPHONE HCL 1 MG/ML IJ SOLN
INTRAMUSCULAR | Status: AC
  Filled 2021-12-30: qty 1

## 2021-12-30 MED ORDER — MIDAZOLAM HCL 2 MG/2ML IJ SOLN: 0.5000 mg | Freq: Once | INTRAMUSCULAR | Status: DC | PRN

## 2021-12-30 MED ORDER — PHENYLEPHRINE HCL (PRESSORS) 10 MG/ML IV SOLN
INTRAVENOUS | Status: DC | PRN
  Administered 2021-12-30: 160 ug via INTRAVENOUS

## 2021-12-30 MED ORDER — ACETAMINOPHEN 10 MG/ML IV SOLN
INTRAVENOUS | Status: AC
  Filled 2021-12-30: qty 100

## 2021-12-30 MED ORDER — CEFAZOLIN SODIUM-DEXTROSE 1-4 GM/50ML-% IV SOLN
INTRAVENOUS | Status: DC | PRN
  Administered 2021-12-30: 1 g via INTRAVENOUS

## 2021-12-30 MED ORDER — SUGAMMADEX SODIUM 200 MG/2ML IV SOLN
INTRAVENOUS | Status: DC | PRN
  Administered 2021-12-30: 200 mg via INTRAVENOUS

## 2021-12-30 MED ORDER — BUPIVACAINE HCL (PF) 0.25 % IJ SOLN
INTRAMUSCULAR | Status: AC
  Filled 2021-12-30: qty 30

## 2021-12-30 MED ORDER — SUCCINYLCHOLINE CHLORIDE 20 MG/ML IJ SOLN
INTRAMUSCULAR | Status: DC | PRN
  Administered 2021-12-30: 120 mg via INTRAVENOUS

## 2021-12-30 MED ORDER — PROPOFOL 10 MG/ML IV BOLUS
INTRAVENOUS | Status: DC | PRN
  Administered 2021-12-30: 200 mg via INTRAVENOUS

## 2021-12-30 MED ORDER — PROMETHAZINE HCL 25 MG/ML IJ SOLN: 6.2500 mg | INTRAMUSCULAR | Status: DC | PRN

## 2021-12-30 MED ORDER — SULFAMETHOXAZOLE-TRIMETHOPRIM 800-160 MG PO TABS: 1.0000 | ORAL_TABLET | Freq: Two times a day (BID) | ORAL | 0 refills | Status: DC

## 2021-12-30 MED ORDER — ROCURONIUM BROMIDE 100 MG/10ML IV SOLN
INTRAVENOUS | Status: DC | PRN
  Administered 2021-12-30: 10 mg via INTRAVENOUS
  Administered 2021-12-30: 50 mg via INTRAVENOUS

## 2021-12-30 MED ORDER — OXYCODONE HCL 5 MG/5ML PO SOLN: 5.0000 mg | Freq: Once | ORAL | Status: AC | PRN

## 2021-12-30 MED ORDER — MEPERIDINE HCL 25 MG/ML IJ SOLN: 6.2500 mg | INTRAMUSCULAR | Status: DC | PRN

## 2021-12-30 MED ORDER — OXYCODONE HCL 5 MG PO TABS
ORAL_TABLET | ORAL | Status: AC
  Filled 2021-12-30: qty 1

## 2021-12-30 MED ORDER — BUPIVACAINE HCL (PF) 0.25 % IJ SOLN
INTRAMUSCULAR | Status: DC | PRN
  Administered 2021-12-30: 9 mL

## 2021-12-30 MED ORDER — ONDANSETRON HCL 4 MG/2ML IJ SOLN
INTRAMUSCULAR | Status: DC | PRN
  Administered 2021-12-30: 4 mg via INTRAVENOUS

## 2021-12-30 MED ORDER — OXYCODONE HCL 5 MG PO TABS
5.0000 mg | ORAL_TABLET | Freq: Once | ORAL | Status: AC | PRN
  Administered 2021-12-30: 5 mg via ORAL

## 2021-12-30 MED ORDER — FENTANYL CITRATE (PF) 100 MCG/2ML IJ SOLN
INTRAMUSCULAR | Status: DC | PRN
  Administered 2021-12-30 (×5): 50 ug via INTRAVENOUS

## 2021-12-30 MED ORDER — HYDROMORPHONE HCL 1 MG/ML IJ SOLN
0.2500 mg | INTRAMUSCULAR | Status: DC | PRN
  Administered 2021-12-30 (×2): 0.5 mg via INTRAVENOUS

## 2021-12-30 MED ORDER — LACTATED RINGERS IV SOLN: INTRAVENOUS | Status: DC | PRN

## 2021-12-30 MED ORDER — LIDOCAINE HCL (CARDIAC) PF 50 MG/5ML IV SOSY
PREFILLED_SYRINGE | INTRAVENOUS | Status: DC | PRN
  Administered 2021-12-30: 40 mg via INTRAVENOUS

## 2021-12-30 MED ORDER — DEXAMETHASONE SODIUM PHOSPHATE 4 MG/ML IJ SOLN
INTRAMUSCULAR | Status: DC | PRN
  Administered 2021-12-30: 10 mg via INTRAVENOUS

## 2021-12-30 MED ORDER — ACETAMINOPHEN 10 MG/ML IV SOLN
INTRAVENOUS | Status: DC | PRN
  Administered 2021-12-30: 1000 mg via INTRAVENOUS

## 2021-12-30 NOTE — Anesthesia Procedure Notes (Signed)
Procedure Name: Intubation Date/Time: 12/30/2021 12:20 AM  Performed by: Edmonia Caprio, CRNAPre-anesthesia Checklist: Patient identified, Emergency Drugs available, Suction available, Patient being monitored and Timeout performed Patient Re-evaluated:Patient Re-evaluated prior to induction Oxygen Delivery Method: Circle system utilized Preoxygenation: Pre-oxygenation with 100% oxygen Induction Type: Rapid sequence, IV induction and Cricoid Pressure applied Laryngoscope Size: Miller and 2 Grade View: Grade II Tube type: Oral Tube size: 7.5 mm Number of attempts: 1 Airway Equipment and Method: Stylet Placement Confirmation: ETT inserted through vocal cords under direct vision, positive ETCO2 and breath sounds checked- equal and bilateral Secured at: 23 cm Tube secured with: Tape Dental Injury: Teeth and Oropharynx as per pre-operative assessment

## 2021-12-30 NOTE — Discharge Instructions (Signed)

## 2021-12-30 NOTE — Anesthesia Postprocedure Evaluation (Signed)
Anesthesia Post Note  Patient: Colton Peters  Procedure(s) Performed: IRRIGATION AND DEBRIDEMENT WOUND, REDUCTION AND FIXATION RIGHT THUMB. REPAIR TENDON/ ARTERY/ MORE AS NECESSARY VS REVSION AMPUTATION (Right) TENDON REPAIR (Right)     Patient location during evaluation: PACU Anesthesia Type: General Level of consciousness: awake and alert, patient cooperative and oriented Pain management: pain level controlled Vital Signs Assessment: post-procedure vital signs reviewed and stable Respiratory status: spontaneous breathing, nonlabored ventilation and respiratory function stable Cardiovascular status: blood pressure returned to baseline and stable Postop Assessment: no apparent nausea or vomiting, adequate PO intake and able to ambulate Anesthetic complications: no   No notable events documented.  Last Vitals:  Vitals:   12/30/21 0300 12/30/21 0315  BP: (!) 142/91 137/76  Pulse: (!) 124 (!) 110  Resp: (!) 23 20  Temp:    SpO2: 96% 96%    Last Pain:  Vitals:   12/30/21 0315  PainSc: 4                  Tremeka Helbling,E. Cailen Mihalik
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
## 2021-12-30 NOTE — Transfer of Care (Signed)
Immediate Anesthesia Transfer of Care Note  Patient: Colton Peters  Procedure(s) Performed: IRRIGATION AND DEBRIDEMENT WOUND, REDUCTION AND FIXATION RIGHT THUMB. REPAIR TENDON/ ARTERY/ MORE AS NECESSARY VS REVSION AMPUTATION (Right) TENDON REPAIR (Right)  Patient Location: PACU  Anesthesia Type:General  Level of Consciousness: awake  Airway & Oxygen Therapy: Patient Spontanous Breathing and Patient connected to nasal cannula oxygen  Post-op Assessment: Report given to RN and Post -op Vital signs reviewed and stable  Post vital signs: Reviewed and stable  Last Vitals:  Vitals Value Taken Time  BP 143/63 12/30/21 0230  Temp    Pulse 120 12/30/21 0232  Resp 19 12/30/21 0232  SpO2 95 % 12/30/21 0232  Vitals shown include unvalidated device data.  Last Pain: There were no vitals filed for this visit.       Complications: No notable events documented.

## 2022-04-14 ENCOUNTER — Other Ambulatory Visit: Payer: Self-pay

## 2022-04-14 ENCOUNTER — Encounter (HOSPITAL_BASED_OUTPATIENT_CLINIC_OR_DEPARTMENT_OTHER): Payer: Self-pay | Admitting: Orthopedic Surgery

## 2022-04-14 ENCOUNTER — Other Ambulatory Visit: Payer: Self-pay | Admitting: Orthopedic Surgery

## 2022-04-18 ENCOUNTER — Ambulatory Visit (HOSPITAL_BASED_OUTPATIENT_CLINIC_OR_DEPARTMENT_OTHER)
Admission: RE | Admit: 2022-04-18 | Discharge: 2022-04-18 | Disposition: A | Discharge status: Home / Self Care | Payer: No Typology Code available for payment source | Attending: Orthopedic Surgery | Admitting: Orthopedic Surgery

## 2022-04-18 ENCOUNTER — Encounter (HOSPITAL_BASED_OUTPATIENT_CLINIC_OR_DEPARTMENT_OTHER)
Admission: RE | Disposition: A | Discharge status: Home / Self Care | Payer: Self-pay | Source: Home / Self Care | Attending: Orthopedic Surgery

## 2022-04-18 ENCOUNTER — Ambulatory Visit (HOSPITAL_BASED_OUTPATIENT_CLINIC_OR_DEPARTMENT_OTHER): Payer: No Typology Code available for payment source | Admitting: Anesthesiology

## 2022-04-18 ENCOUNTER — Other Ambulatory Visit: Payer: Self-pay

## 2022-04-18 ENCOUNTER — Encounter (HOSPITAL_BASED_OUTPATIENT_CLINIC_OR_DEPARTMENT_OTHER): Payer: Self-pay | Admitting: Orthopedic Surgery

## 2022-04-18 DIAGNOSIS — L987 Excessive and redundant skin and subcutaneous tissue: Secondary | ICD-10-CM | POA: Insufficient documentation

## 2022-04-18 DIAGNOSIS — W3400XS Accidental discharge from unspecified firearms or gun, sequela: Secondary | ICD-10-CM | POA: Diagnosis not present

## 2022-04-18 DIAGNOSIS — S61001D Unspecified open wound of right thumb without damage to nail, subsequent encounter: Secondary | ICD-10-CM

## 2022-04-18 DIAGNOSIS — Z981 Arthrodesis status: Secondary | ICD-10-CM | POA: Insufficient documentation

## 2022-04-18 DIAGNOSIS — F17210 Nicotine dependence, cigarettes, uncomplicated: Secondary | ICD-10-CM | POA: Diagnosis not present

## 2022-04-18 HISTORY — PX: HYPOTHENAR FAT PAD TRANSFER: SHX6408

## 2022-04-18 HISTORY — DX: Other specified health status: Z78.9

## 2022-04-18 SURGERY — HYPOTHENAR FAT PAD TRANSFER
Anesthesia: General | Site: Thumb | Laterality: Right

## 2022-04-18 MED ORDER — FENTANYL CITRATE (PF) 100 MCG/2ML IJ SOLN
INTRAMUSCULAR | Status: AC
  Filled 2022-04-18: qty 2

## 2022-04-18 MED ORDER — LIDOCAINE 2% (20 MG/ML) 5 ML SYRINGE
INTRAMUSCULAR | Status: DC | PRN
  Administered 2022-04-18: 100 mg via INTRAVENOUS

## 2022-04-18 MED ORDER — PROMETHAZINE HCL 25 MG/ML IJ SOLN: 6.2500 mg | INTRAMUSCULAR | Status: DC | PRN

## 2022-04-18 MED ORDER — FENTANYL CITRATE (PF) 100 MCG/2ML IJ SOLN
INTRAMUSCULAR | Status: DC | PRN
  Administered 2022-04-18 (×2): 50 ug via INTRAVENOUS

## 2022-04-18 MED ORDER — MIDAZOLAM HCL 2 MG/2ML IJ SOLN
INTRAMUSCULAR | Status: AC
  Filled 2022-04-18: qty 2

## 2022-04-18 MED ORDER — LACTATED RINGERS IV SOLN: INTRAVENOUS | Status: DC

## 2022-04-18 MED ORDER — DEXAMETHASONE SODIUM PHOSPHATE 10 MG/ML IJ SOLN
INTRAMUSCULAR | Status: DC | PRN
  Administered 2022-04-18: 5 mg via INTRAVENOUS

## 2022-04-18 MED ORDER — ONDANSETRON HCL 4 MG/2ML IJ SOLN
INTRAMUSCULAR | Status: DC | PRN
  Administered 2022-04-18: 4 mg via INTRAVENOUS

## 2022-04-18 MED ORDER — PROPOFOL 10 MG/ML IV BOLUS
INTRAVENOUS | Status: DC | PRN
  Administered 2022-04-18: 200 mg via INTRAVENOUS

## 2022-04-18 MED ORDER — CEFAZOLIN SODIUM-DEXTROSE 2-4 GM/100ML-% IV SOLN
2.0000 g | INTRAVENOUS | Status: AC
  Administered 2022-04-18: 2 g via INTRAVENOUS

## 2022-04-18 MED ORDER — PROPOFOL 500 MG/50ML IV EMUL
INTRAVENOUS | Status: AC
  Filled 2022-04-18: qty 50

## 2022-04-18 MED ORDER — ACETAMINOPHEN 500 MG PO TABS
ORAL_TABLET | ORAL | Status: AC
  Filled 2022-04-18: qty 2

## 2022-04-18 MED ORDER — 0.9 % SODIUM CHLORIDE (POUR BTL) OPTIME
TOPICAL | Status: DC | PRN
  Administered 2022-04-18: 1000 mL

## 2022-04-18 MED ORDER — MIDAZOLAM HCL 5 MG/5ML IJ SOLN
INTRAMUSCULAR | Status: DC | PRN
  Administered 2022-04-18: 2 mg via INTRAVENOUS

## 2022-04-18 MED ORDER — BUPIVACAINE HCL (PF) 0.25 % IJ SOLN
INTRAMUSCULAR | Status: DC | PRN
  Administered 2022-04-18: 9 mL

## 2022-04-18 MED ORDER — ACETAMINOPHEN 500 MG PO TABS
1000.0000 mg | ORAL_TABLET | Freq: Once | ORAL | Status: AC
  Administered 2022-04-18: 1000 mg via ORAL

## 2022-04-18 MED ORDER — KETOROLAC TROMETHAMINE 30 MG/ML IJ SOLN
INTRAMUSCULAR | Status: DC | PRN
  Administered 2022-04-18: 30 mg via INTRAVENOUS

## 2022-04-18 MED ORDER — FENTANYL CITRATE (PF) 100 MCG/2ML IJ SOLN: 25.0000 ug | INTRAMUSCULAR | Status: DC | PRN

## 2022-04-18 MED ORDER — DEXAMETHASONE SODIUM PHOSPHATE 10 MG/ML IJ SOLN
INTRAMUSCULAR | Status: AC
  Filled 2022-04-18: qty 1

## 2022-04-18 MED ORDER — HYDROCODONE-ACETAMINOPHEN 5-325 MG PO TABS: ORAL_TABLET | ORAL | 0 refills | Status: AC

## 2022-04-18 MED ORDER — ONDANSETRON HCL 4 MG/2ML IJ SOLN
INTRAMUSCULAR | Status: AC
  Filled 2022-04-18: qty 2

## 2022-04-18 MED ORDER — CEFAZOLIN SODIUM-DEXTROSE 2-4 GM/100ML-% IV SOLN
INTRAVENOUS | Status: AC
  Filled 2022-04-18: qty 100

## 2022-04-18 SURGICAL SUPPLY — 58 items
APL PRP STRL LF DISP 70% ISPRP (MISCELLANEOUS) ×1
APL SKNCLS STERI-STRIP NONHPOA (GAUZE/BANDAGES/DRESSINGS)
BANDAGE GAUZE 1X75IN STRL (MISCELLANEOUS) IMPLANT
BENZOIN TINCTURE PRP APPL 2/3 (GAUZE/BANDAGES/DRESSINGS) IMPLANT
BLADE MINI RND TIP GREEN BEAV (BLADE) IMPLANT
BLADE SURG 15 STRL LF DISP TIS (BLADE) ×2 IMPLANT
BLADE SURG 15 STRL SS (BLADE) ×2
BNDG CMPR 5X2 CHSV 1 LYR STRL (GAUZE/BANDAGES/DRESSINGS)
BNDG CMPR 5X2 KNTD ELC UNQ LF (GAUZE/BANDAGES/DRESSINGS)
BNDG CMPR 5X3 KNIT ELC UNQ LF (GAUZE/BANDAGES/DRESSINGS)
BNDG CMPR 75X11 PLY HI ABS (MISCELLANEOUS)
BNDG CMPR 75X21 PLY HI ABS (MISCELLANEOUS)
BNDG CMPR 9X4 STRL LF SNTH (GAUZE/BANDAGES/DRESSINGS)
BNDG COHESIVE 1X5 TAN STRL LF (GAUZE/BANDAGES/DRESSINGS) IMPLANT
BNDG COHESIVE 2X5 TAN ST LF (GAUZE/BANDAGES/DRESSINGS) IMPLANT
BNDG ELASTIC 2INX 5YD STR LF (GAUZE/BANDAGES/DRESSINGS) IMPLANT
BNDG ELASTIC 3INX 5YD STR LF (GAUZE/BANDAGES/DRESSINGS) IMPLANT
BNDG ESMARK 4X9 LF (GAUZE/BANDAGES/DRESSINGS) IMPLANT
BNDG GAUZE 1X75IN STRL (MISCELLANEOUS)
BNDG GAUZE DERMACEA FLUFF 4 (GAUZE/BANDAGES/DRESSINGS) IMPLANT
BNDG GZE DERMACEA 4 6PLY (GAUZE/BANDAGES/DRESSINGS)
BNDG PLASTER X FAST 3X3 WHT LF (CAST SUPPLIES) IMPLANT
BNDG PLSTR 9X3 FST ST WHT (CAST SUPPLIES)
CHLORAPREP W/TINT 26 (MISCELLANEOUS) ×1 IMPLANT
CORD BIPOLAR FORCEPS 12FT (ELECTRODE) ×1 IMPLANT
COVER BACK TABLE 60X90IN (DRAPES) ×1 IMPLANT
COVER MAYO STAND STRL (DRAPES) ×1 IMPLANT
CUFF TOURN SGL QUICK 18X4 (TOURNIQUET CUFF) ×1 IMPLANT
DRAPE EXTREMITY T 121X128X90 (DISPOSABLE) ×1 IMPLANT
DRAPE SURG 17X23 STRL (DRAPES) ×1 IMPLANT
GAUZE SPONGE 4X4 12PLY STRL (GAUZE/BANDAGES/DRESSINGS) ×1 IMPLANT
GAUZE STRETCH 2X75IN STRL (MISCELLANEOUS) IMPLANT
GAUZE XEROFORM 1X8 LF (GAUZE/BANDAGES/DRESSINGS) ×1 IMPLANT
GLOVE BIO SURGEON STRL SZ7.5 (GLOVE) ×1 IMPLANT
GLOVE BIOGEL PI IND STRL 8 (GLOVE) ×1 IMPLANT
GOWN STRL REUS W/ TWL LRG LVL3 (GOWN DISPOSABLE) ×1 IMPLANT
GOWN STRL REUS W/TWL LRG LVL3 (GOWN DISPOSABLE) ×1
GOWN STRL REUS W/TWL XL LVL3 (GOWN DISPOSABLE) ×1 IMPLANT
NDL HYPO 25X1 1.5 SAFETY (NEEDLE) ×1 IMPLANT
NEEDLE HYPO 25X1 1.5 SAFETY (NEEDLE) ×1 IMPLANT
NS IRRIG 1000ML POUR BTL (IV SOLUTION) ×1 IMPLANT
PACK BASIN DAY SURGERY FS (CUSTOM PROCEDURE TRAY) ×1 IMPLANT
PAD CAST 3X4 CTTN HI CHSV (CAST SUPPLIES) IMPLANT
PAD CAST 4YDX4 CTTN HI CHSV (CAST SUPPLIES) IMPLANT
PADDING CAST ABS COTTON 4X4 ST (CAST SUPPLIES) ×1 IMPLANT
PADDING CAST COTTON 3X4 STRL (CAST SUPPLIES)
PADDING CAST COTTON 4X4 STRL (CAST SUPPLIES)
SPLINT FINGER 2.25 911902 (SOFTGOODS) IMPLANT
STOCKINETTE 4X48 STRL (DRAPES) ×1 IMPLANT
STRIP CLOSURE SKIN 1/2X4 (GAUZE/BANDAGES/DRESSINGS) IMPLANT
SUT ETHILON 3 0 PS 1 (SUTURE) IMPLANT
SUT ETHILON 4 0 PS 2 18 (SUTURE) ×1 IMPLANT
SUT NYLON ETHILON 5-0 P-3 1X18 (SUTURE) IMPLANT
SUT VIC AB 4-0 P2 18 (SUTURE) IMPLANT
SYR BULB EAR ULCER 3OZ GRN STR (SYRINGE) ×1 IMPLANT
SYR CONTROL 10ML LL (SYRINGE) ×1 IMPLANT
TOWEL GREEN STERILE FF (TOWEL DISPOSABLE) ×2 IMPLANT
UNDERPAD 30X36 HEAVY ABSORB (UNDERPADS AND DIAPERS) ×1 IMPLANT
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
## 2022-04-18 NOTE — Discharge Instructions (Addendum)
[facility] Instructions Hand Surgery  Wound Care: Keep your hand elevated above the level of your heart.  Do not allow it to dangle by your side.  Keep the dressing dry and do not remove it unless your doctor advises you to do so.  He will usually change it at the time of your post-op visit.  Moving your fingers is advised to stimulate circulation but will depend on the site of your surgery.  If you have a splint applied, your doctor will advise you regarding movement.  Activity: Do not drive or operate machinery today.  Rest today and then you may return to your normal activity and work as indicated by your physician.  Diet:  Drink liquids today or eat a light diet.  You may resume a regular diet tomorrow.    General expectations: Pain for two to three days. Fingers may become slightly swollen.  Call your doctor if any of the following occur: Severe pain not relieved by pain medication. Elevated temperature. Dressing soaked with blood. Inability to move fingers. White or bluish color to fingers.    No Tylenol until 5:30pm today if needed No ibuprofen until after 7:30pm today if needed  Post Anesthesia Home Care Instructions  Activity: Get plenty of rest for the remainder of the day. A responsible individual must stay with you for 24 hours following the procedure.  For the next 24 hours, DO NOT: -Drive a car -Advertising copywriter -Drink alcoholic beverages -Take any medication unless instructed by your physician -Make any legal decisions or sign important papers.  Meals: Start with liquid foods such as gelatin or soup. Progress to regular foods as tolerated. Avoid greasy, spicy, heavy foods. If nausea and/or vomiting occur, drink only clear liquids until the nausea and/or vomiting subsides. Call your physician if vomiting continues.  Special Instructions/Symptoms: Your throat may feel dry or sore from the anesthesia or the breathing tube placed in your throat during surgery.  If this causes discomfort, gargle with warm salt water. The discomfort should disappear within 24 hours.  If you had a scopolamine patch placed behind your ear for the management of post- operative nausea and/or vomiting:  1. The medication in the patch is effective for 72 hours, after which it should be removed.  Wrap patch in a tissue and discard in the trash. Wash hands thoroughly with soap and water. 2. You may remove the patch earlier than 72 hours if you experience unpleasant side effects which may include dry mouth, dizziness or visual disturbances. 3. Avoid touching the patch. Wash your hands with soap and water after contact with the patch.

## 2022-04-18 NOTE — Anesthesia Postprocedure Evaluation (Signed)
Anesthesia Post Note  Patient: RILEY HALLUM  Procedure(s) Performed: RIGHT THUMB EXCISION EXCESS TISSUE (Right: Thumb)     Patient location during evaluation: PACU Anesthesia Type: General Level of consciousness: awake and alert Pain management: pain level controlled Vital Signs Assessment: post-procedure vital signs reviewed and stable Respiratory status: spontaneous breathing, nonlabored ventilation and respiratory function stable Cardiovascular status: blood pressure returned to baseline and stable Postop Assessment: no apparent nausea or vomiting Anesthetic complications: no   No notable events documented.  Last Vitals:  Vitals:   04/18/22 1415 04/18/22 1440  BP: (!) 118/91 (!) 142/95  Pulse: 75 78  Resp: 14 16  Temp:  36.7 C  SpO2: 96% 96%    Last Pain:  Vitals:   04/18/22 1440  TempSrc:   PainSc: 0-No pain                 Collene Schlichter
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
## 2022-04-18 NOTE — Transfer of Care (Signed)
Immediate Anesthesia Transfer of Care Note  Patient: Colton Peters  Procedure(s) Performed: RIGHT THUMB EXCISION EXCESS TISSUE (Right: Thumb)  Patient Location: PACU  Anesthesia Type:General  Level of Consciousness: sedated  Airway & Oxygen Therapy: Patient Spontanous Breathing and Patient connected to face mask oxygen  Post-op Assessment: Report given to RN and Post -op Vital signs reviewed and stable  Post vital signs: Reviewed and stable  Last Vitals:  Vitals Value Taken Time  BP 101/67 04/18/22 1355  Temp    Pulse 57 04/18/22 1400  Resp 5 04/18/22 1400  SpO2 99 % 04/18/22 1400  Vitals shown include unvalidated device data.  Last Pain:  Vitals:   04/18/22 1114  TempSrc: Temporal  PainSc: 0-No pain      Patients Stated Pain Goal: 3 (04/18/22 1114)  Complications: No notable events documented.

## 2022-04-18 NOTE — Anesthesia Procedure Notes (Signed)
Procedure Name: LMA Insertion Date/Time: 04/18/2022 1:15 PM  Performed by: Burna Cash, CRNAPre-anesthesia Checklist: Patient identified, Emergency Drugs available, Suction available and Patient being monitored Patient Re-evaluated:Patient Re-evaluated prior to induction Oxygen Delivery Method: Circle system utilized Preoxygenation: Pre-oxygenation with 100% oxygen Induction Type: IV induction Ventilation: Mask ventilation without difficulty LMA: LMA inserted LMA Size: 4.0 Number of attempts: 1 Airway Equipment and Method: Bite block Placement Confirmation: positive ETCO2 Tube secured with: Tape Dental Injury: Teeth and Oropharynx as per pre-operative assessment

## 2022-04-19 ENCOUNTER — Encounter (HOSPITAL_BASED_OUTPATIENT_CLINIC_OR_DEPARTMENT_OTHER): Payer: Self-pay | Admitting: Orthopedic Surgery
